# Patient Record
Sex: Female | Born: 1965 | Race: White | Hispanic: No | Marital: Married | Smoking: Former smoker
Health system: Southern US, Community
[De-identification: ages and names within clinical notes are randomized; demographics above are authoritative.]

## PROBLEM LIST (undated history)

## (undated) DIAGNOSIS — I10 Essential (primary) hypertension: Secondary | ICD-10-CM

## (undated) DIAGNOSIS — F319 Bipolar disorder, unspecified: Secondary | ICD-10-CM

## (undated) DIAGNOSIS — G4733 Obstructive sleep apnea (adult) (pediatric): Secondary | ICD-10-CM

## (undated) DIAGNOSIS — K589 Irritable bowel syndrome without diarrhea: Secondary | ICD-10-CM

## (undated) DIAGNOSIS — F329 Major depressive disorder, single episode, unspecified: Secondary | ICD-10-CM

## (undated) DIAGNOSIS — K224 Dyskinesia of esophagus: Secondary | ICD-10-CM

## (undated) DIAGNOSIS — Z8639 Personal history of other endocrine, nutritional and metabolic disease: Secondary | ICD-10-CM

## (undated) DIAGNOSIS — K2970 Gastritis, unspecified, without bleeding: Secondary | ICD-10-CM

## (undated) DIAGNOSIS — F419 Anxiety disorder, unspecified: Secondary | ICD-10-CM

## (undated) DIAGNOSIS — F32A Depression, unspecified: Secondary | ICD-10-CM

## (undated) DIAGNOSIS — Z9989 Dependence on other enabling machines and devices: Secondary | ICD-10-CM

## (undated) DIAGNOSIS — K2980 Duodenitis without bleeding: Secondary | ICD-10-CM

## (undated) DIAGNOSIS — E039 Hypothyroidism, unspecified: Secondary | ICD-10-CM

## (undated) DIAGNOSIS — K310 Acute dilatation of stomach: Secondary | ICD-10-CM

## (undated) DIAGNOSIS — K2210 Ulcer of esophagus without bleeding: Secondary | ICD-10-CM

## (undated) DIAGNOSIS — E669 Obesity, unspecified: Secondary | ICD-10-CM

## (undated) DIAGNOSIS — I872 Venous insufficiency (chronic) (peripheral): Secondary | ICD-10-CM

## (undated) DIAGNOSIS — E079 Disorder of thyroid, unspecified: Secondary | ICD-10-CM

## (undated) DIAGNOSIS — K253 Acute gastric ulcer without hemorrhage or perforation: Secondary | ICD-10-CM

## (undated) HISTORY — PX: GASTRIC BYPASS: SHX52

## (undated) HISTORY — PX: COLONOSCOPY: SHX174

## (undated) HISTORY — PX: ESOPHAGOGASTRODUODENOSCOPY: SHX1529

---
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
## 2000-10-05 HISTORY — PX: INTRAUTERINE DEVICE (IUD) INSERTION: SHX5877

## 2007-01-06 HISTORY — PX: CARPAL TUNNEL RELEASE: SHX101

## 2009-08-17 ENCOUNTER — Emergency Department: Payer: Self-pay | Admitting: Emergency Medicine

## 2010-01-05 HISTORY — PX: WISDOM TOOTH EXTRACTION: SHX21

## 2010-09-24 ENCOUNTER — Emergency Department: Payer: Self-pay | Admitting: *Deleted

## 2011-04-17 ENCOUNTER — Emergency Department: Payer: Self-pay | Admitting: Internal Medicine

## 2011-04-19 ENCOUNTER — Emergency Department: Payer: Self-pay | Admitting: Emergency Medicine

## 2013-09-01 ENCOUNTER — Ambulatory Visit: Payer: Self-pay | Admitting: Specialist

## 2013-10-16 ENCOUNTER — Emergency Department: Payer: Self-pay | Admitting: Emergency Medicine

## 2013-11-07 ENCOUNTER — Ambulatory Visit: Payer: Self-pay | Admitting: Gastroenterology

## 2013-12-25 ENCOUNTER — Ambulatory Visit: Payer: Self-pay | Admitting: Gastroenterology

## 2014-02-13 ENCOUNTER — Ambulatory Visit: Payer: Self-pay | Admitting: Specialist

## 2014-02-13 DIAGNOSIS — Z01812 Encounter for preprocedural laboratory examination: Secondary | ICD-10-CM | POA: Diagnosis not present

## 2014-02-20 ENCOUNTER — Inpatient Hospital Stay: Payer: Self-pay | Admitting: Specialist

## 2014-02-20 DIAGNOSIS — F319 Bipolar disorder, unspecified: Secondary | ICD-10-CM | POA: Diagnosis not present

## 2014-02-20 DIAGNOSIS — Z6841 Body Mass Index (BMI) 40.0 and over, adult: Secondary | ICD-10-CM | POA: Diagnosis not present

## 2014-02-20 DIAGNOSIS — Z79899 Other long term (current) drug therapy: Secondary | ICD-10-CM | POA: Diagnosis not present

## 2014-02-20 DIAGNOSIS — G4730 Sleep apnea, unspecified: Secondary | ICD-10-CM | POA: Diagnosis not present

## 2014-02-20 DIAGNOSIS — I10 Essential (primary) hypertension: Secondary | ICD-10-CM | POA: Diagnosis not present

## 2014-02-20 DIAGNOSIS — G4733 Obstructive sleep apnea (adult) (pediatric): Secondary | ICD-10-CM | POA: Diagnosis not present

## 2014-02-20 DIAGNOSIS — E049 Nontoxic goiter, unspecified: Secondary | ICD-10-CM | POA: Diagnosis not present

## 2014-02-20 DIAGNOSIS — Z87891 Personal history of nicotine dependence: Secondary | ICD-10-CM | POA: Diagnosis not present

## 2014-04-20 ENCOUNTER — Ambulatory Visit
Admit: 2014-04-20 | Disposition: A | Discharge status: Home / Self Care | Payer: Self-pay | Attending: Internal Medicine | Admitting: Internal Medicine

## 2014-04-20 DIAGNOSIS — E039 Hypothyroidism, unspecified: Secondary | ICD-10-CM | POA: Diagnosis not present

## 2014-04-20 DIAGNOSIS — I10 Essential (primary) hypertension: Secondary | ICD-10-CM | POA: Diagnosis not present

## 2014-04-20 DIAGNOSIS — L538 Other specified erythematous conditions: Secondary | ICD-10-CM | POA: Diagnosis not present

## 2014-04-20 DIAGNOSIS — R600 Localized edema: Secondary | ICD-10-CM | POA: Diagnosis not present

## 2014-04-30 LAB — SURGICAL PATHOLOGY

## 2014-05-04 DIAGNOSIS — R740 Nonspecific elevation of levels of transaminase and lactic acid dehydrogenase [LDH]: Secondary | ICD-10-CM | POA: Diagnosis not present

## 2014-05-06 NOTE — Op Note (Signed)
PATIENT NAME:  Colleen Newman, Colleen Newman MR#:  [id number] DATE OF BIRTH:  July 24, 1965  DATE OF PROCEDURE:  02/20/2014  PREOPERATIVE DIAGNOSIS: Morbid obesity.  POSTOPERATIVE DIAGNOSIS: Morbid obesity.   PROCEDURE: Laparoscopic Roux-en-Y gastric bypass.  SURGEON: Kreg Shropshire, MD    ASSISTANT: Mardelle Matte, PA-C   COMPLICATIONS: None.   ANESTHESIA: General endotracheal.  FINDINGS: None.  SPECIMENS: None.   BLOOD LOSS: None.  CLINICAL HISTORY: See history and physical.   DETAILS OF PROCEDURE: The patient was taken to the operating room, placed on the operating table in the supine position. Appropriate monitors and supplemental oxygen were delivered. Broad-spectrum IV antibiotics were given. The patient was placed under general anesthesia without incident. The abdomen was prepped and draped in the usual sterile fashion. Sequential stockings and IV antibiotics were given. The abdomen was accessed using a 5 mm optical trocar in the left upper outer quadrant. Pneumoperitoneum was established without difficulty. Multiple other trocars were placed. The ligament of Treitz was identified and the bowel was measured 50 cm distal. It was transected using an echelon white load reinforced with Seamguard stapler. The mesentery was taken down to its base using a Harmonic scalpel and a 125 cm Roux limb was measured. A side-to-side jejunojejunostomy was created in the following fashion: A stitch was used to approximate the 2 limbs and enterotomies were made on either side. An echelon white load 60 mm stapler was inserted into both lumens and fired. The resultant defect was closed by reapproximating the edges with interrupted Surgidac suture and firing across it and using an echelon blue load stapler. A small bit of oozing from the staple line was easily controlled using a clip applier. The (Dictation Anomaly)babys butt stitch was placed x 1 and the mesenteric defect was closed using running 2-0 Surgidac. The omentum was  split widely using the Harmonic scalpel up to the transverse colon creating a pass of the Roux limb to the upper stomach. The Roux limb was sutured to the greater curvature of the stomach and a 45 mL pouch was created in the following fashion: The mesentery was opened using a Harmonic scalpel and the descending branch of the right gastric artery was divided using echelon white load reinforced with Seamguard. Multiple gold loads of Seamguard were fired on the stomach to create a small gastric pouch. The Roux limb was then mobilized up to the left upper quadrant and attached to the underside of the stomach using interrupted suture. An enterotomy and gastrotomy were created and an echelon blue load was inserted to 30 mm and fired. The resultant defect was closed using running 2-0 Polysorb suture. It was oversewn with a running 3-0 Polysorb suture with a 36 French bougie in place. A leak test was performed and no leak was detected. The bougie was subsequently removed. The trocars were removed after some Surgiflo was placed on the anastomosis because of the small oozing from needle holes. The wounds were closed using 4-0 Vicryl and Dermabond. The patient tolerated the procedure well and arrived to the recovery room stable condition to be admitted for further care.     ____________________________ Kreg Shropshire, MD jb:bm D: 02/20/2014 16:33:10 ET T: 02/20/2014 23:55:36 ET JOB#: [id number]  cc: Kreg Shropshire, MD, <Dictator> Wille Glaser Langley Adie MD ELECTRONICALLY SIGNED 02/21/2014 16:10

## 2014-05-14 DIAGNOSIS — R740 Nonspecific elevation of levels of transaminase and lactic acid dehydrogenase [LDH]: Secondary | ICD-10-CM | POA: Diagnosis not present

## 2014-05-15 DIAGNOSIS — R748 Abnormal levels of other serum enzymes: Secondary | ICD-10-CM | POA: Diagnosis not present

## 2014-07-23 DIAGNOSIS — R740 Nonspecific elevation of levels of transaminase and lactic acid dehydrogenase [LDH]: Secondary | ICD-10-CM | POA: Diagnosis not present

## 2014-08-03 DIAGNOSIS — E039 Hypothyroidism, unspecified: Secondary | ICD-10-CM | POA: Diagnosis not present

## 2014-08-03 DIAGNOSIS — I10 Essential (primary) hypertension: Secondary | ICD-10-CM | POA: Diagnosis not present

## 2014-08-03 DIAGNOSIS — R748 Abnormal levels of other serum enzymes: Secondary | ICD-10-CM | POA: Diagnosis not present

## 2014-08-03 DIAGNOSIS — Z6841 Body Mass Index (BMI) 40.0 and over, adult: Secondary | ICD-10-CM | POA: Diagnosis not present

## 2015-02-18 DIAGNOSIS — Z9884 Bariatric surgery status: Secondary | ICD-10-CM | POA: Diagnosis not present

## 2015-02-18 DIAGNOSIS — K912 Postsurgical malabsorption, not elsewhere classified: Secondary | ICD-10-CM | POA: Diagnosis not present

## 2015-03-12 DIAGNOSIS — Z1231 Encounter for screening mammogram for malignant neoplasm of breast: Secondary | ICD-10-CM | POA: Diagnosis not present

## 2015-03-12 DIAGNOSIS — Z01419 Encounter for gynecological examination (general) (routine) without abnormal findings: Secondary | ICD-10-CM | POA: Diagnosis not present

## 2015-03-12 DIAGNOSIS — Z1212 Encounter for screening for malignant neoplasm of rectum: Secondary | ICD-10-CM | POA: Diagnosis not present

## 2015-03-12 DIAGNOSIS — Z30014 Encounter for initial prescription of intrauterine contraceptive device: Secondary | ICD-10-CM | POA: Diagnosis not present

## 2015-03-14 DIAGNOSIS — Z9989 Dependence on other enabling machines and devices: Secondary | ICD-10-CM | POA: Diagnosis not present

## 2015-03-14 DIAGNOSIS — G4733 Obstructive sleep apnea (adult) (pediatric): Secondary | ICD-10-CM | POA: Diagnosis not present

## 2015-03-14 DIAGNOSIS — E039 Hypothyroidism, unspecified: Secondary | ICD-10-CM | POA: Diagnosis not present

## 2015-03-14 DIAGNOSIS — I10 Essential (primary) hypertension: Secondary | ICD-10-CM | POA: Diagnosis not present

## 2015-03-14 DIAGNOSIS — Z23 Encounter for immunization: Secondary | ICD-10-CM | POA: Diagnosis not present

## 2015-03-14 DIAGNOSIS — Z86010 Personal history of colonic polyps: Secondary | ICD-10-CM | POA: Diagnosis not present

## 2015-03-14 DIAGNOSIS — Z87891 Personal history of nicotine dependence: Secondary | ICD-10-CM | POA: Diagnosis not present

## 2015-03-19 DIAGNOSIS — Z9884 Bariatric surgery status: Secondary | ICD-10-CM | POA: Diagnosis not present

## 2015-03-19 DIAGNOSIS — K912 Postsurgical malabsorption, not elsewhere classified: Secondary | ICD-10-CM | POA: Diagnosis not present

## 2015-03-20 DIAGNOSIS — H5213 Myopia, bilateral: Secondary | ICD-10-CM | POA: Diagnosis not present

## 2015-03-20 DIAGNOSIS — H5210 Myopia, unspecified eye: Secondary | ICD-10-CM | POA: Diagnosis not present

## 2015-03-20 DIAGNOSIS — Z30430 Encounter for insertion of intrauterine contraceptive device: Secondary | ICD-10-CM | POA: Diagnosis not present

## 2015-03-27 DIAGNOSIS — Z86010 Personal history of colonic polyps: Secondary | ICD-10-CM | POA: Diagnosis not present

## 2015-03-27 DIAGNOSIS — K2270 Barrett's esophagus without dysplasia: Secondary | ICD-10-CM | POA: Diagnosis not present

## 2015-03-28 ENCOUNTER — Encounter: Payer: Self-pay | Admitting: *Deleted

## 2015-03-29 ENCOUNTER — Ambulatory Visit
Admission: RE | Admit: 2015-03-29 | Discharge: 2015-03-29 | Disposition: A | Discharge status: Home / Self Care | Payer: Commercial Managed Care - HMO | Source: Ambulatory Visit | Attending: Gastroenterology | Admitting: Gastroenterology

## 2015-03-29 ENCOUNTER — Encounter
Admission: RE | Disposition: A | Discharge status: Home / Self Care | Payer: Self-pay | Source: Ambulatory Visit | Attending: Gastroenterology

## 2015-03-29 ENCOUNTER — Ambulatory Visit: Payer: Commercial Managed Care - HMO | Admitting: Anesthesiology

## 2015-03-29 ENCOUNTER — Encounter: Payer: Self-pay | Admitting: *Deleted

## 2015-03-29 DIAGNOSIS — D124 Benign neoplasm of descending colon: Secondary | ICD-10-CM | POA: Insufficient documentation

## 2015-03-29 DIAGNOSIS — E039 Hypothyroidism, unspecified: Secondary | ICD-10-CM | POA: Diagnosis not present

## 2015-03-29 DIAGNOSIS — G4733 Obstructive sleep apnea (adult) (pediatric): Secondary | ICD-10-CM | POA: Diagnosis not present

## 2015-03-29 DIAGNOSIS — Z79899 Other long term (current) drug therapy: Secondary | ICD-10-CM | POA: Insufficient documentation

## 2015-03-29 DIAGNOSIS — F319 Bipolar disorder, unspecified: Secondary | ICD-10-CM | POA: Diagnosis not present

## 2015-03-29 DIAGNOSIS — F419 Anxiety disorder, unspecified: Secondary | ICD-10-CM | POA: Diagnosis not present

## 2015-03-29 DIAGNOSIS — Z9884 Bariatric surgery status: Secondary | ICD-10-CM | POA: Insufficient documentation

## 2015-03-29 DIAGNOSIS — Z9989 Dependence on other enabling machines and devices: Secondary | ICD-10-CM | POA: Insufficient documentation

## 2015-03-29 DIAGNOSIS — Z87891 Personal history of nicotine dependence: Secondary | ICD-10-CM | POA: Insufficient documentation

## 2015-03-29 DIAGNOSIS — Z8719 Personal history of other diseases of the digestive system: Secondary | ICD-10-CM | POA: Insufficient documentation

## 2015-03-29 DIAGNOSIS — Z86010 Personal history of colonic polyps: Secondary | ICD-10-CM | POA: Diagnosis not present

## 2015-03-29 DIAGNOSIS — K219 Gastro-esophageal reflux disease without esophagitis: Secondary | ICD-10-CM | POA: Diagnosis not present

## 2015-03-29 DIAGNOSIS — Z1211 Encounter for screening for malignant neoplasm of colon: Secondary | ICD-10-CM | POA: Insufficient documentation

## 2015-03-29 DIAGNOSIS — I10 Essential (primary) hypertension: Secondary | ICD-10-CM | POA: Insufficient documentation

## 2015-03-29 DIAGNOSIS — K635 Polyp of colon: Secondary | ICD-10-CM | POA: Diagnosis not present

## 2015-03-29 DIAGNOSIS — K210 Gastro-esophageal reflux disease with esophagitis: Secondary | ICD-10-CM | POA: Diagnosis not present

## 2015-03-29 DIAGNOSIS — I739 Peripheral vascular disease, unspecified: Secondary | ICD-10-CM | POA: Diagnosis not present

## 2015-03-29 HISTORY — DX: Ulcer of esophagus without bleeding: K22.10

## 2015-03-29 HISTORY — DX: Irritable bowel syndrome, unspecified: K58.9

## 2015-03-29 HISTORY — DX: Major depressive disorder, single episode, unspecified: F32.9

## 2015-03-29 HISTORY — DX: Venous insufficiency (chronic) (peripheral): I87.2

## 2015-03-29 HISTORY — DX: Bipolar disorder, unspecified: F31.9

## 2015-03-29 HISTORY — DX: Hypothyroidism, unspecified: E03.9

## 2015-03-29 HISTORY — DX: Duodenitis without bleeding: K29.80

## 2015-03-29 HISTORY — DX: Acute gastric ulcer without hemorrhage or perforation: K25.3

## 2015-03-29 HISTORY — DX: Dyskinesia of esophagus: K22.4

## 2015-03-29 HISTORY — DX: Depression, unspecified: F32.A

## 2015-03-29 HISTORY — DX: Personal history of other endocrine, nutritional and metabolic disease: Z86.39

## 2015-03-29 HISTORY — DX: Obesity, unspecified: E66.9

## 2015-03-29 HISTORY — DX: Essential (primary) hypertension: I10

## 2015-03-29 HISTORY — DX: Disorder of thyroid, unspecified: E07.9

## 2015-03-29 HISTORY — PX: ESOPHAGOGASTRODUODENOSCOPY (EGD) WITH PROPOFOL: SHX5813

## 2015-03-29 HISTORY — DX: Obstructive sleep apnea (adult) (pediatric): G47.33

## 2015-03-29 HISTORY — PX: COLONOSCOPY WITH PROPOFOL: SHX5780

## 2015-03-29 HISTORY — DX: Gastritis, unspecified, without bleeding: K29.70

## 2015-03-29 HISTORY — DX: Anxiety disorder, unspecified: F41.9

## 2015-03-29 HISTORY — DX: Acute dilatation of stomach: K31.0

## 2015-03-29 HISTORY — DX: Dependence on other enabling machines and devices: Z99.89

## 2015-03-29 LAB — POCT PREGNANCY, URINE: Preg Test, Ur: NEGATIVE

## 2015-03-29 SURGERY — COLONOSCOPY WITH PROPOFOL
Anesthesia: General

## 2015-03-29 MED ORDER — SODIUM CHLORIDE 0.9 % IV SOLN
INTRAVENOUS | Status: DC
  Administered 2015-03-29: 13:00:00 via INTRAVENOUS

## 2015-03-29 MED ORDER — SODIUM CHLORIDE 0.9 % IV SOLN: INTRAVENOUS | Status: DC

## 2015-03-29 MED ORDER — PROPOFOL 10 MG/ML IV BOLUS
INTRAVENOUS | Status: DC | PRN
  Administered 2015-03-29: 100 mg via INTRAVENOUS

## 2015-03-29 MED ORDER — GLYCOPYRROLATE 0.2 MG/ML IJ SOLN
INTRAMUSCULAR | Status: DC | PRN
  Administered 2015-03-29: 0.2 mg via INTRAVENOUS

## 2015-03-29 MED ORDER — PROPOFOL 500 MG/50ML IV EMUL
INTRAVENOUS | Status: DC | PRN
  Administered 2015-03-29: 100 ug/kg/min via INTRAVENOUS

## 2015-03-29 MED ORDER — PHENYLEPHRINE HCL 10 MG/ML IJ SOLN
INTRAMUSCULAR | Status: DC | PRN
  Administered 2015-03-29: 100 ug via INTRAVENOUS

## 2015-03-29 MED ORDER — MIDAZOLAM HCL 2 MG/2ML IJ SOLN
INTRAMUSCULAR | Status: DC | PRN
  Administered 2015-03-29: 1 mg via INTRAVENOUS

## 2015-03-29 MED ORDER — FENTANYL CITRATE (PF) 100 MCG/2ML IJ SOLN
INTRAMUSCULAR | Status: DC | PRN
  Administered 2015-03-29: 50 ug via INTRAVENOUS

## 2015-03-29 NOTE — H&P (Signed)
Outpatient short stay form Pre-procedure 03/29/2015 2:00 PM Lollie Sails MD  Primary Physician: Dr. Glendon Axe  Reason for visit:  EGD and colonoscopy  History of present illness:  Patient is a 50 year old female presenting today for EGD and colonoscopy. This in regards to her personal history of Barrett's esophagus as well as adenomatous colon polyps. Her last colonoscopy was about 5 years ago but had polyps on previous colonoscopies. She also has a history of a gastric bypass done in February 2016 and a finding of some gastric ulcers on EGD previous to her procedure that was found to be endoscopically verified healed. She tolerated her prep well. She denies use of any aspirin products or blood thinning agents.    Current facility-administered medications:  .  0.9 %  sodium chloride infusion, , Intravenous, Continuous, Lollie Sails, MD, Last Rate: 20 mL/hr at 03/29/15 1312 .  0.9 %  sodium chloride infusion, , Intravenous, Continuous, Lollie Sails, MD  Prescriptions prior to admission  Medication Sig Dispense Refill Last Dose  . albuterol (PROVENTIL HFA;VENTOLIN HFA) 108 (90 Base) MCG/ACT inhaler Inhale 2 puffs into the lungs every 6 (six) hours as needed for wheezing or shortness of breath.     . ARIPiprazole (ABILIFY) 10 MG tablet Take 10 mg by mouth daily.     Marland Kitchen buPROPion (WELLBUTRIN) 100 MG tablet Take 100 mg by mouth 2 (two) times daily.     . Calcium-Vitamin D-Vitamin K ([employer] SOFT CHEWS CALCIUM PO) Take by mouth.     . citalopram (CELEXA) 40 MG tablet Take 40 mg by mouth daily.     . clonazePAM (KLONOPIN) 0.5 MG tablet Take 0.5 mg by mouth 2 (two) times daily as needed for anxiety.     Marland Kitchen levothyroxine (SYNTHROID, LEVOTHROID) 175 MCG tablet Take 175 mcg by mouth daily before breakfast.     . losartan (COZAAR) 25 MG tablet Take 25 mg by mouth daily.   03/29/2015 at 0830  . Multiple Vitamin (MULTIVITAMIN) tablet Take 1 tablet by mouth daily.        Allergies   Allergen Reactions  . Banana   . Penicillins   . Tetracyclines & Related      Past Medical History  Diagnosis Date  . Anxiety   . Bipolar disorder ([place])   . Depression   . Duodenitis   . Erosive esophagitis   . Esophageal motility disorder   . Gastric ulcer, acute with obstruction     Non-obstructing  . Gastritis   . History of hypothyroidism   . Hypothyroidism   . Hypertension   . IBS (irritable bowel syndrome)   . Obesity   . OSA on CPAP   . Thyroid disease   . Venous insufficiency     Review of systems:      Physical Exam    Heart and lungs: Regular rate and rhythm without rub or gallop, lungs are bilaterally clear.    HEENT: Normocephalic atraumatic eyes are anicteric    Other:     Pertinant exam for procedure: Soft nontender, obese, nondistended, bowel sounds positive normoactive.    Planned proceedures: EGD, colonoscopy  I have discussed the risks benefits and complications of procedures to include not limited to bleeding, infection, perforation and the risk of sedation and the patient wishes to proceed.and indicated procedures.    Lollie Sails, MD Gastroenterology 03/29/2015  2:00 PM

## 2015-03-29 NOTE — Op Note (Signed)
[facility] Gastroenterology Patient Name: Colleen Newman Procedure Date: 03/29/2015 2:20 PM MRN: [id number] Account #: [id number] Date of Birth: 1965/08/27 Admit Type: Outpatient Age: 50 Room: [facility] ENDO ROOM 3 Gender: Female Note Status: Finalized Procedure:            Colonoscopy Indications:          Personal history of colonic polyps Providers:            Lollie Sails, MD Referring MD:         Glendon Axe (Referring MD) Medicines:            Monitored Anesthesia Care Complications:        No immediate complications. Procedure:            Pre-Anesthesia Assessment:                       - ASA Grade Assessment: III - A patient with severe                        systemic disease.                       After obtaining informed consent, the colonoscope was                        passed under direct vision. Throughout the procedure,                        the patient's blood pressure, pulse, and oxygen                        saturations were monitored continuously. The                        Colonoscope was introduced through the anus and                        advanced to the the cecum, identified by appendiceal                        orifice and ileocecal valve. The colonoscopy was                        performed without difficulty. The patient tolerated the                        procedure well. The quality of the bowel preparation                        was good. Findings:      A 3 mm polyp was found in the descending colon. The polyp was sessile.       The polyp was removed with a cold biopsy forceps. Resection and       retrieval were complete. The polyp was removed with a cold biopsy       forceps. Resection and retrieval were complete.      The exam was otherwise normal throughout the examined colon.      The digital rectal exam was normal. Impression:           - One 3 mm polyp in the descending colon, removed with  a  cold biopsy forceps. Resected and retrieved. Recommendation:       - Discharge patient to home. Procedure Code(s):    --- Professional ---                       [id number], Colonoscopy, flexible; with biopsy, single or                        multiple Diagnosis Code(s):    --- Professional ---                       D12.4, Benign neoplasm of descending colon                       Z86.010, Personal history of colonic polyps CPT copyright 2016 American Medical Association. All rights reserved. The codes documented in this report are preliminary and upon coder review may  be revised to meet current compliance requirements. Lollie Sails, MD 03/29/2015 3:01:15 PM This report has been signed electronically. Number of Addenda: 0 Note Initiated On: 03/29/2015 2:20 PM Scope Withdrawal Time: 0 hours 4 minutes 52 seconds  Total Procedure Duration: 0 hours 17 minutes 21 seconds       [facility]

## 2015-03-29 NOTE — Op Note (Signed)
[facility] Gastroenterology Patient Name: Colleen Newman Procedure Date: 03/29/2015 2:20 PM MRN: [id number] Account #: [id number] Date of Birth: 01-Nov-1965 Admit Type: Outpatient Age: 50 Room: [facility] ENDO ROOM 3 Gender: Female Note Status: Finalized Procedure:            Upper GI endoscopy Indications:          Follow-up of Barrett's esophagus Providers:            Lollie Sails, MD Referring MD:         Glendon Axe (Referring MD) Medicines:            Monitored Anesthesia Care Complications:        No immediate complications. Procedure:            Pre-Anesthesia Assessment:                       - ASA Grade Assessment: III - A patient with severe                        systemic disease.                       After obtaining informed consent, the endoscope was                        passed under direct vision. Throughout the procedure,                        the patient's blood pressure, pulse, and oxygen                        saturations were monitored continuously. The Endoscope                        was introduced through the mouth, and advanced to the                        afferent jejunal loop. The upper GI endoscopy was                        accomplished without difficulty. The patient tolerated                        the procedure well. Findings:      The Z-line was regular. Mucosa was biopsied with a cold forceps for       histology in 4 quadrants.      The exam of the esophagus was otherwise normal.      Evidence of a Roux-en-Y anastomosis was found in the gastric body. This       was characterized by healthy appearing mucosa. The gastric pouch is       about 6 cm in size. The efferent limb was healthy in appearance with a       short afferent, also healthy in appearence. Impression:           - Z-line regular. Biopsied.                       - A Roux-en-Y anastomosis was found, characterized by  healthy appearing  mucosa. Recommendation:       - Await pathology results.                       - Continue present medications.                       - Telephone GI clinic for pathology results in 1 week. Procedure Code(s):    --- Professional ---                       [phone], Esophagogastroduodenoscopy, flexible, transoral;                        with biopsy, single or multiple Diagnosis Code(s):    --- Professional ---                       K22.70, Barrett's esophagus without dysplasia                       Z98.84, Bariatric surgery status CPT copyright 2016 American Medical Association. All rights reserved. The codes documented in this report are preliminary and upon coder review may  be revised to meet current compliance requirements. Lollie Sails, MD 03/29/2015 2:39:22 PM This report has been signed electronically. Number of Addenda: 0 Note Initiated On: 03/29/2015 2:20 PM      [facility]

## 2015-03-29 NOTE — Anesthesia Preprocedure Evaluation (Signed)
Anesthesia Evaluation  Patient identified by MRN, date of birth, ID band Patient awake    Reviewed: Allergy & Precautions, NPO status , Patient's Chart, lab work & pertinent test results  History of Anesthesia Complications Negative for: history of anesthetic complications  Airway Mallampati: III       Dental   Pulmonary sleep apnea and Continuous Positive Airway Pressure Ventilation , former smoker,           Cardiovascular hypertension, Pt. on medications + Peripheral Vascular Disease       Neuro/Psych Anxiety Depression Bipolar Disorder negative neurological ROS     GI/Hepatic negative GI ROS, Neg liver ROS, PUD,   Endo/Other  Hypothyroidism   Renal/GU negative Renal ROS     Musculoskeletal   Abdominal   Peds  Hematology  (+) anemia ,   Anesthesia Other Findings   Reproductive/Obstetrics                             Anesthesia Physical Anesthesia Plan  ASA: III  Anesthesia Plan: General   Post-op Pain Management:    Induction: Intravenous  Airway Management Planned: Nasal Cannula  Additional Equipment:   Intra-op Plan:   Post-operative Plan:   Informed Consent: I have reviewed the patients History and Physical, chart, labs and discussed the procedure including the risks, benefits and alternatives for the proposed anesthesia with the patient or authorized representative who has indicated his/her understanding and acceptance.     Plan Discussed with:   Anesthesia Plan Comments:         Anesthesia Quick Evaluation

## 2015-03-29 NOTE — Anesthesia Postprocedure Evaluation (Signed)
Anesthesia Post Note  Patient: Colleen Newman  Procedure(s) Performed: Procedure(s) (LRB): COLONOSCOPY WITH PROPOFOL (N/A) ESOPHAGOGASTRODUODENOSCOPY (EGD) WITH PROPOFOL (N/A)  Patient location during evaluation: Endoscopy Anesthesia Type: General Level of consciousness: awake and alert Pain management: pain level controlled Vital Signs Assessment: post-procedure vital signs reviewed and stable Respiratory status: spontaneous breathing and respiratory function stable Cardiovascular status: stable Anesthetic complications: no    Last Vitals:  Filed Vitals:   03/29/15 1515 03/29/15 1525  BP: 119/64 102/68  Pulse: 72 61  Temp:    Resp: 20 16    Last Pain: There were no vitals filed for this visit.               KEPHART,WILLIAM K

## 2015-03-29 NOTE — Transfer of Care (Signed)
Immediate Anesthesia Transfer of Care Note  Patient: Colleen Newman  Procedure(s) Performed: Procedure(s): COLONOSCOPY WITH PROPOFOL (N/A) ESOPHAGOGASTRODUODENOSCOPY (EGD) WITH PROPOFOL (N/A)  Patient Location: PACU and Endoscopy Unit  Anesthesia Type:General  Level of Consciousness: patient cooperative and lethargic  Airway & Oxygen Therapy: Patient Spontanous Breathing and Patient connected to nasal cannula oxygen  Post-op Assessment: Report given to RN and Post -op Vital signs reviewed and stable  Post vital signs: Reviewed and stable  Last Vitals:  Filed Vitals:   03/29/15 1255 03/29/15 1505  BP: 119/66 133/89  Pulse: 64 70  Temp: 36.8 C 35.9 C  Resp: 18 22    Complications: No apparent anesthesia complications

## 2015-03-30 ENCOUNTER — Encounter: Payer: Self-pay | Admitting: Gastroenterology

## 2015-04-02 LAB — SURGICAL PATHOLOGY

## 2015-04-03 DIAGNOSIS — Z1231 Encounter for screening mammogram for malignant neoplasm of breast: Secondary | ICD-10-CM | POA: Diagnosis not present

## 2015-04-15 DIAGNOSIS — Z30431 Encounter for routine checking of intrauterine contraceptive device: Secondary | ICD-10-CM | POA: Diagnosis not present

## 2016-02-18 IMAGING — US ABDOMEN ULTRASOUND LIMITED
1 series · 14 of 25 positions shown · non-contrast
Comparison: None.

CLINICAL DATA: Upper abdominal pain

EXAM:
US ABDOMEN LIMITED - RIGHT UPPER QUADRANT

[Series 1: abdomen ultrasound limited · 0.31mm/px · 14 of 36 slices shown]
[im 1/36]
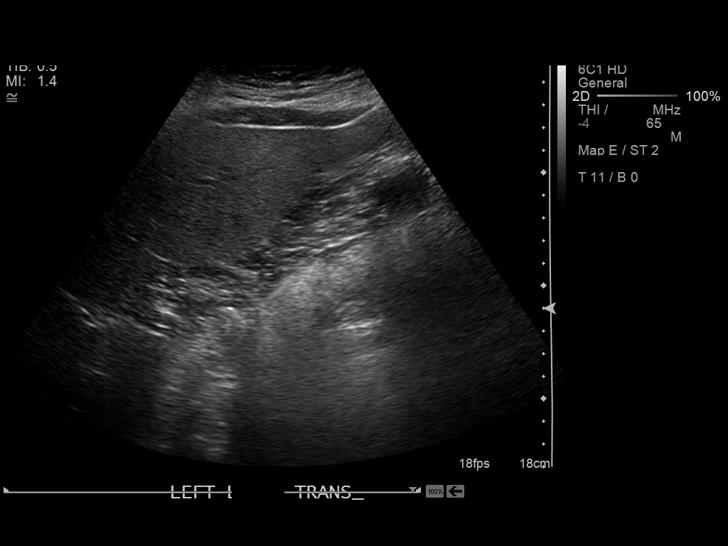
[im 3/36]
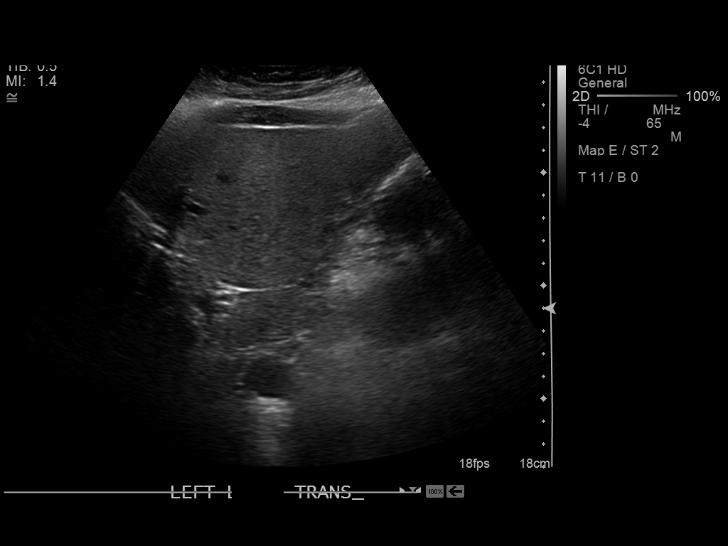
[im 6/36]
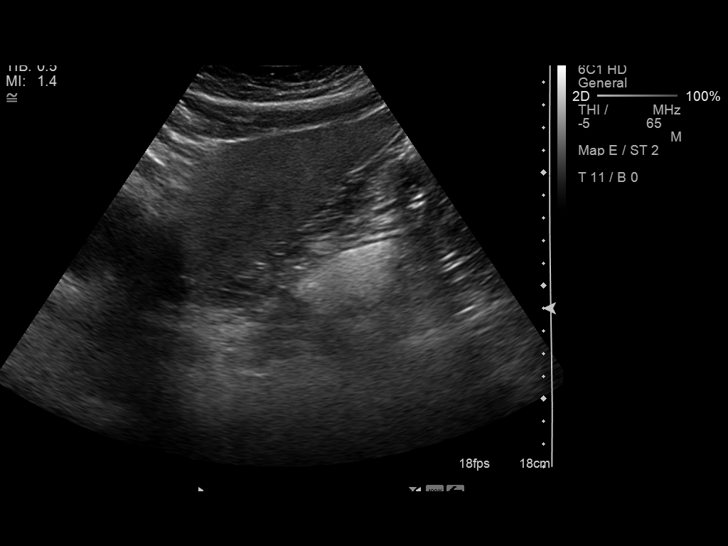
[im 9/36]
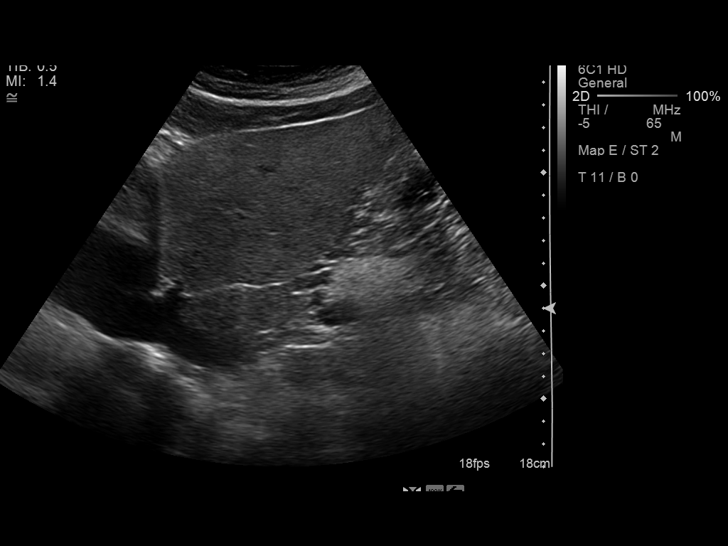
[im 12/36]
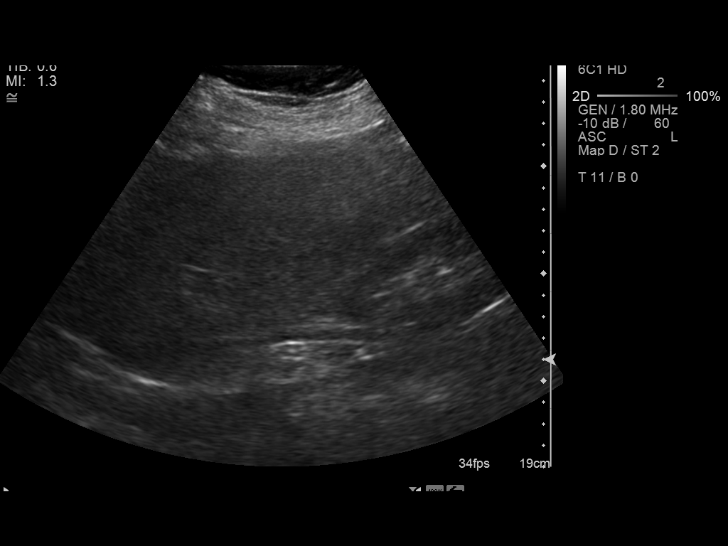
[im 14/36]
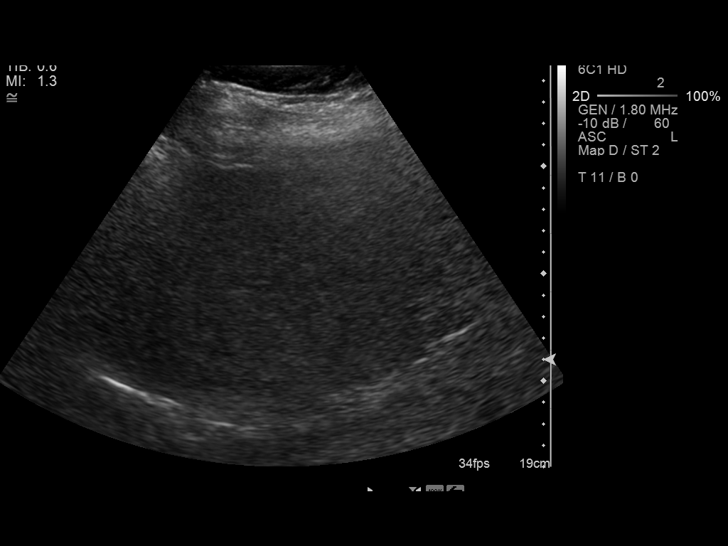
[im 17/36]
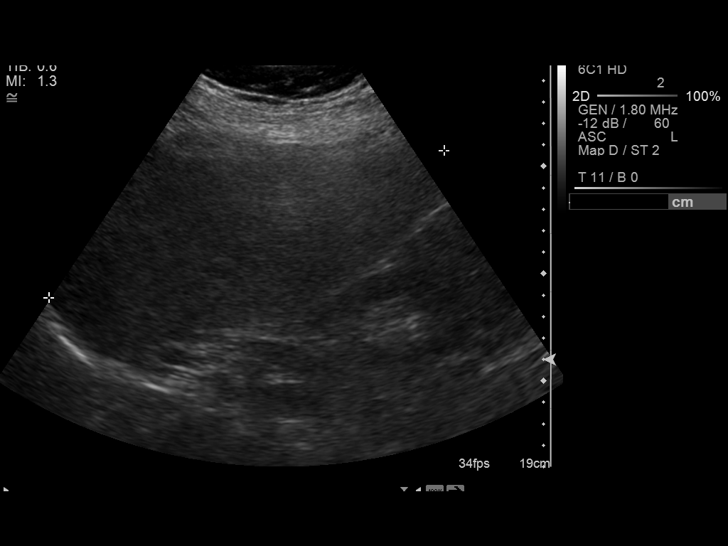
[im 19/36]
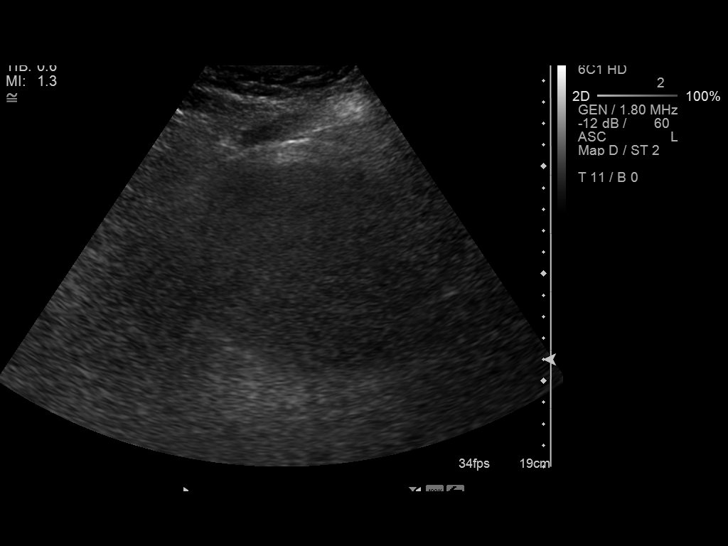
[im 22/36]
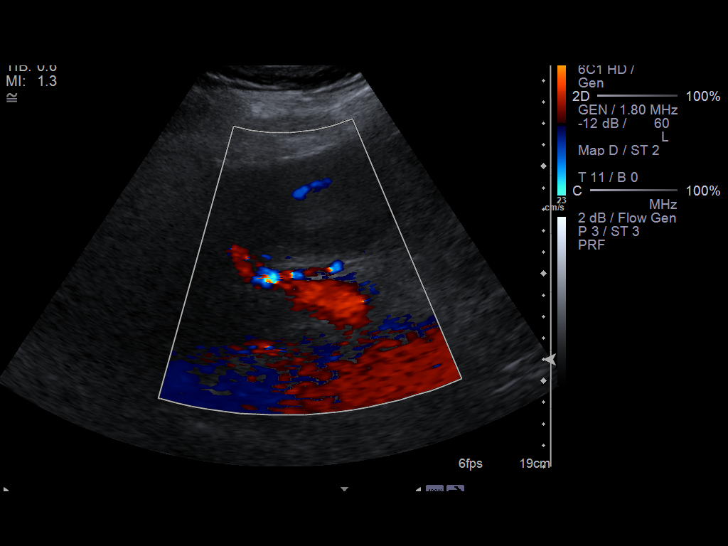
[im 24/36]
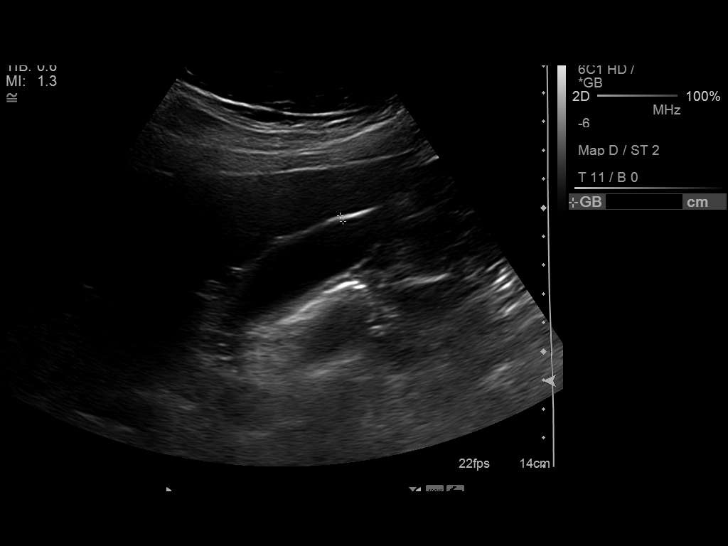
[im 27/36]
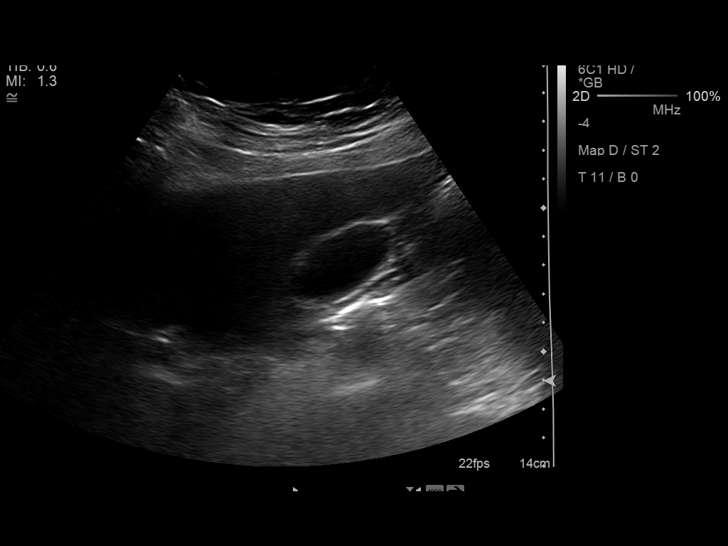
[im 30/36]
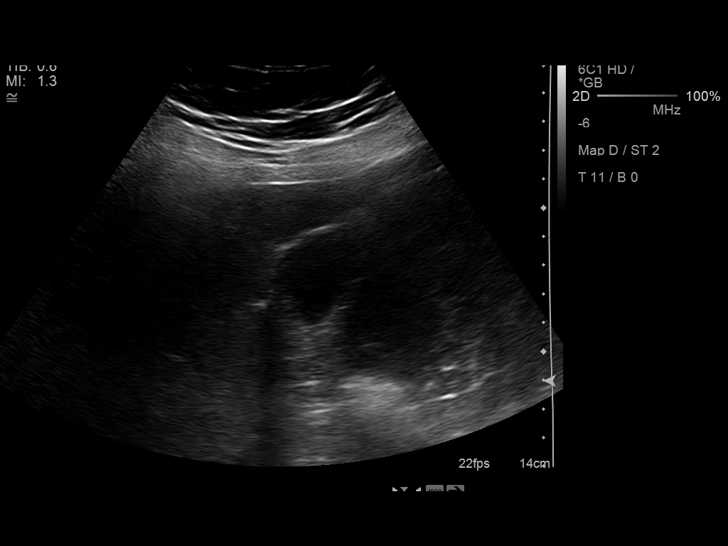
[im 33/36]
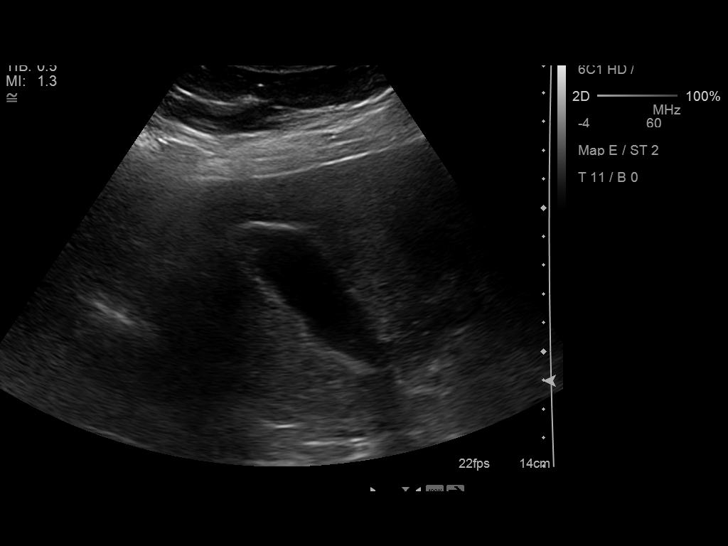
[im 36/36]
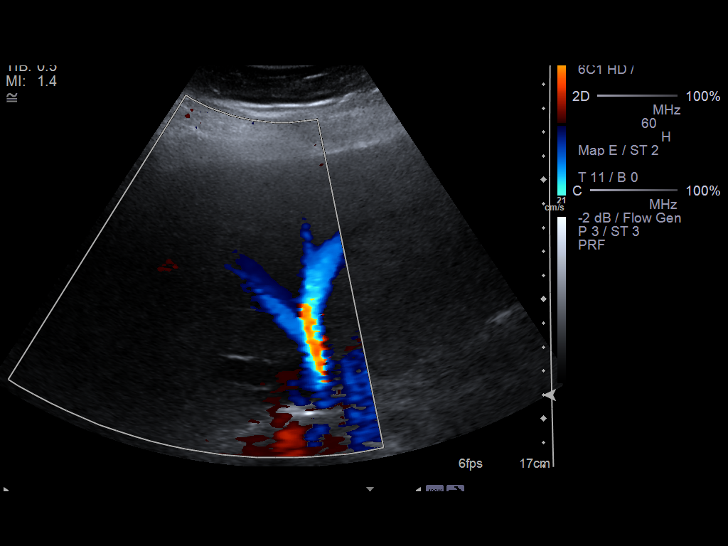

[14 of 25 positions shown; findings below may reference images not displayed]

FINDINGS: Gallbladder:

No gallstones or wall thickening visualized. There is no
pericholecystic fluid. No sonographic Murphy sign noted.

Common bile duct:

Diameter: 4 mm.

Liver:

No focal lesion identified. Liver is enlarged, measuring 19.7 cm in
length. There is diffuse increased liver echogenicity.
IMPRESSION: Diffuse increased liver echogenicity, most likely due to hepatic
steatosis. Liver is enlarged. While no focal liver lesions are
identified, it must be cautioned that the sensitivity of ultrasound
for focal liver lesions is diminished in this circumstance. Study
otherwise unremarkable.
